# Patient Record
Sex: Female | Born: 1980 | Race: White | Hispanic: No | Marital: Married
Health system: Southern US, Community
[De-identification: ages and names within clinical notes are randomized; demographics above are authoritative.]

---

## 2008-07-04 ENCOUNTER — Emergency Department (HOSPITAL_COMMUNITY): Admission: EM | Admit: 2008-07-04 | Discharge: 2008-07-04 | Payer: Self-pay | Admitting: Family Medicine

## 2010-02-11 ENCOUNTER — Emergency Department (HOSPITAL_COMMUNITY): Admission: EM | Admit: 2010-02-11 | Discharge: 2010-02-12 | Payer: Self-pay | Admitting: Emergency Medicine

## 2010-10-28 LAB — DIFFERENTIAL
Basophils Relative: 0 % (ref 0–1)
Lymphocytes Relative: 5 % — ABNORMAL LOW (ref 12–46)
Lymphs Abs: 0.3 10*3/uL — ABNORMAL LOW (ref 0.7–4.0)
Monocytes Absolute: 0.3 10*3/uL (ref 0.1–1.0)
Monocytes Relative: 4 % (ref 3–12)
Neutro Abs: 5.9 10*3/uL (ref 1.7–7.7)

## 2010-10-28 LAB — URINALYSIS, ROUTINE W REFLEX MICROSCOPIC
Bilirubin Urine: NEGATIVE
Glucose, UA: NEGATIVE mg/dL
Specific Gravity, Urine: 1.03 (ref 1.005–1.030)
pH: 6 (ref 5.0–8.0)

## 2010-10-28 LAB — URINE MICROSCOPIC-ADD ON

## 2010-10-28 LAB — CBC
HCT: 44.1 % (ref 36.0–46.0)
Hemoglobin: 15.5 g/dL — ABNORMAL HIGH (ref 12.0–15.0)
MCHC: 35.2 g/dL (ref 30.0–36.0)
RBC: 4.94 MIL/uL (ref 3.87–5.11)

## 2010-10-28 LAB — POCT PREGNANCY, URINE: Preg Test, Ur: NEGATIVE

## 2010-10-28 LAB — BASIC METABOLIC PANEL
GFR calc Af Amer: >60 mL/min (ref >60)
GFR calc non Af Amer: >60 mL/min (ref >60)
Glucose, Bld: 129 mg/dL — ABNORMAL HIGH (ref 70–99)
Potassium: 3.4 mEq/L — ABNORMAL LOW (ref 3.5–5.1)
Sodium: 137 mEq/L (ref 135–145)

## 2011-03-22 ENCOUNTER — Ambulatory Visit: Payer: Self-pay | Admitting: Family Medicine

## 2011-06-28 ENCOUNTER — Ambulatory Visit: Payer: Self-pay | Admitting: Urology

## 2011-10-07 ENCOUNTER — Encounter: Payer: Self-pay | Admitting: Obstetrics & Gynecology

## 2011-10-24 ENCOUNTER — Encounter: Payer: Self-pay | Admitting: Obstetrics & Gynecology

## 2011-11-07 ENCOUNTER — Encounter: Payer: Self-pay | Admitting: Obstetrics & Gynecology

## 2011-11-14 ENCOUNTER — Encounter: Payer: Self-pay | Admitting: Maternal & Fetal Medicine

## 2011-11-21 ENCOUNTER — Encounter: Payer: Self-pay | Admitting: Obstetrics & Gynecology

## 2011-11-28 ENCOUNTER — Encounter: Payer: Self-pay | Admitting: Obstetrics & Gynecology

## 2011-12-02 ENCOUNTER — Inpatient Hospital Stay: Payer: Self-pay | Admitting: Obstetrics and Gynecology

## 2011-12-02 LAB — CBC WITH DIFFERENTIAL/PLATELET
Basophil %: 0.3 %
Eosinophil %: 0.2 %
HGB: 13.3 g/dL (ref 12.0–16.0)
Lymphocyte #: 1.1 10*3/uL (ref 1.0–3.6)
Lymphocyte %: 16.3 %
MCH: 30.3 pg (ref 26.0–34.0)
MCHC: 33.6 g/dL (ref 32.0–36.0)
MCV: 90 fL (ref 80–100)
Neutrophil %: 77.6 %
WBC: 6.9 10*3/uL (ref 3.6–11.0)

## 2013-02-19 ENCOUNTER — Ambulatory Visit: Payer: Self-pay | Admitting: Internal Medicine

## 2013-02-23 ENCOUNTER — Ambulatory Visit: Payer: Self-pay | Admitting: Surgery

## 2013-02-25 LAB — PATHOLOGY REPORT

## 2014-03-17 ENCOUNTER — Ambulatory Visit: Payer: Self-pay | Admitting: Obstetrics and Gynecology

## 2014-03-17 LAB — HEMOGLOBIN: HGB: 13.5 g/dL (ref 12.0–16.0)

## 2014-03-21 ENCOUNTER — Ambulatory Visit: Payer: Self-pay | Admitting: Obstetrics and Gynecology

## 2014-03-23 LAB — PATHOLOGY REPORT

## 2014-03-26 IMAGING — US US OB FOLLOW-UP - NRPT [facility]
1 series · 14 of 28 positions shown · non-contrast
Comparison: none

[Series 1: us ob follow-up - nrpt [facility] · 14 of 57 slices shown]
[im 3/57]
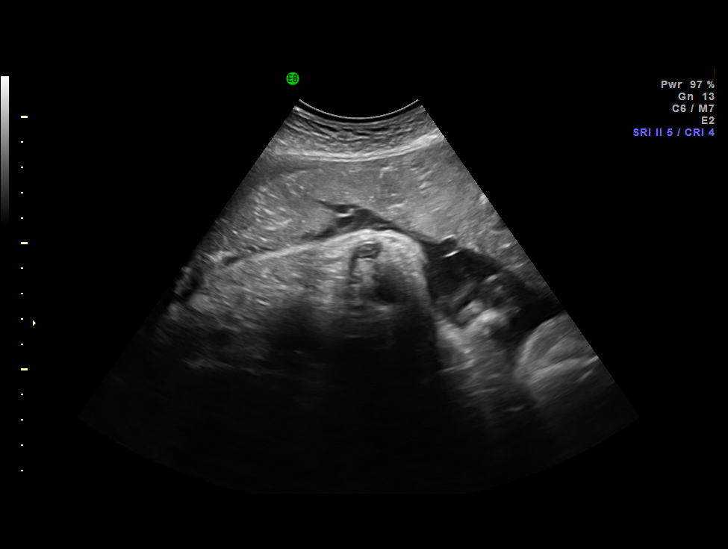
[im 7/57]
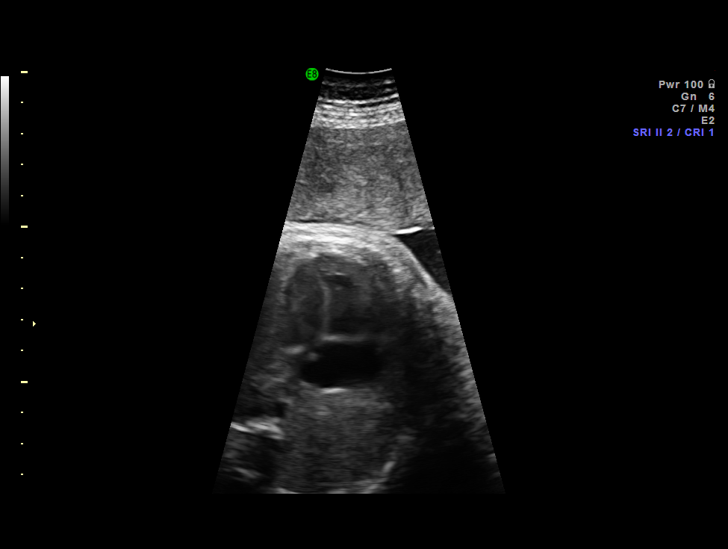
[im 11/57]
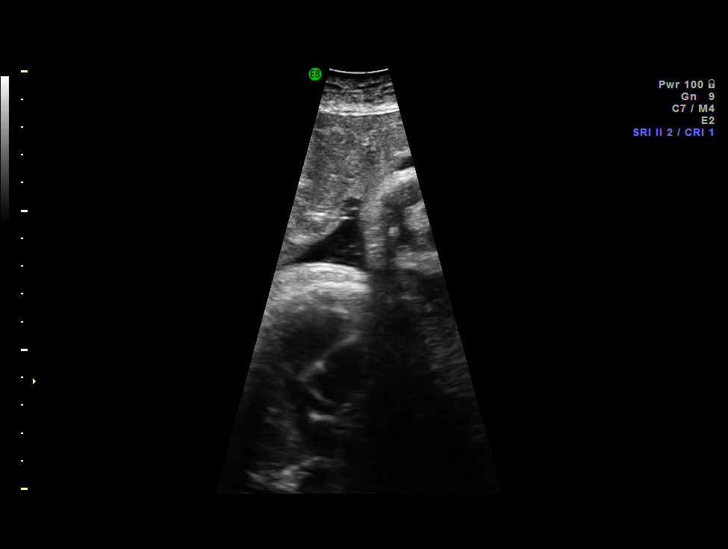
[im 15/57]
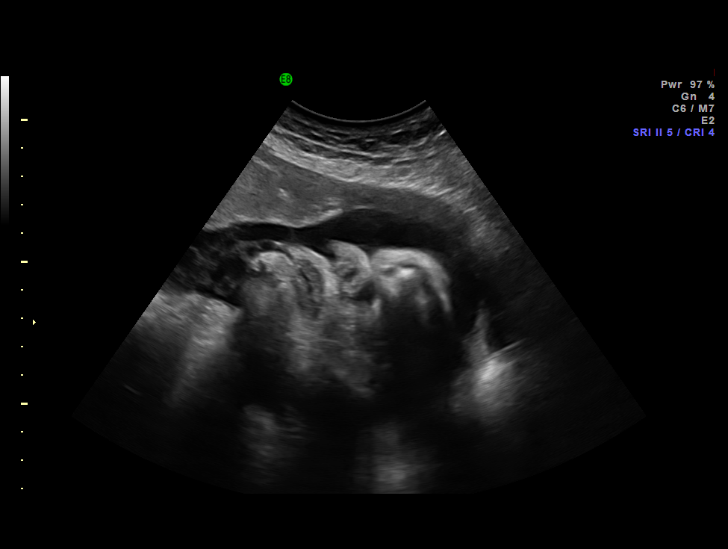
[im 19/57]
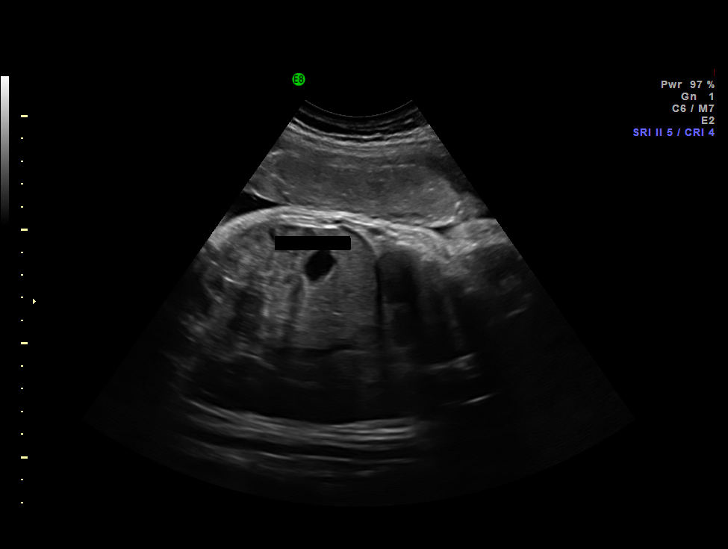
[im 23/57]
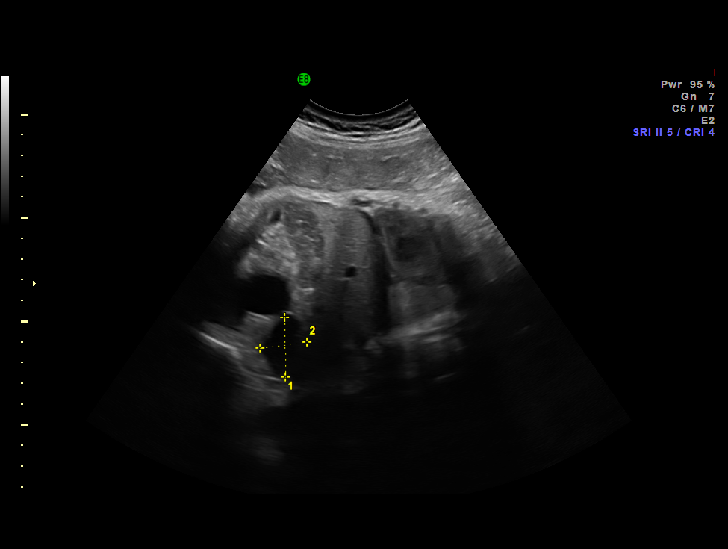
[im 27/57]
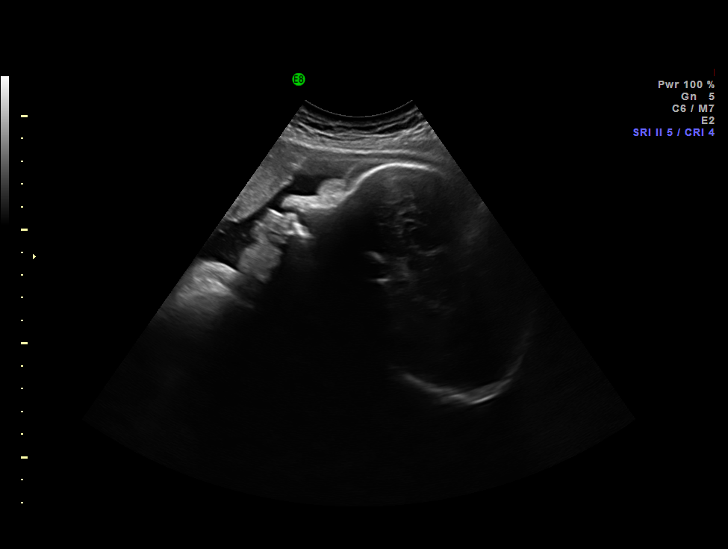
[im 32/57]
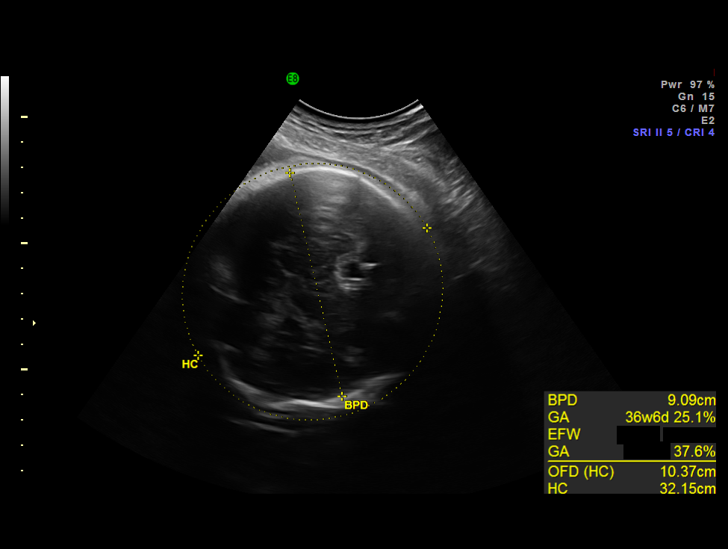
[im 36/57]
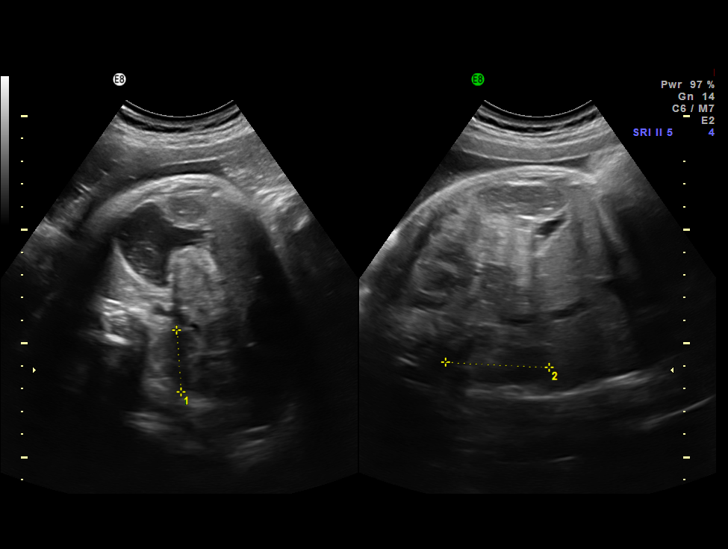
[im 40/57]
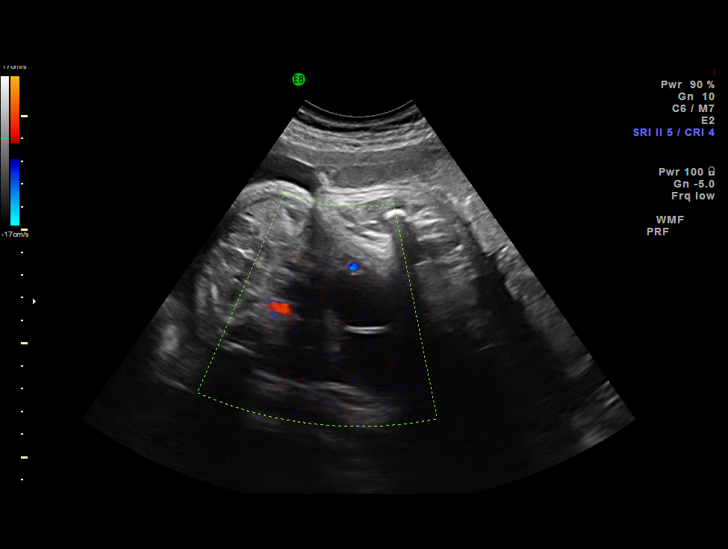
[im 44/57]
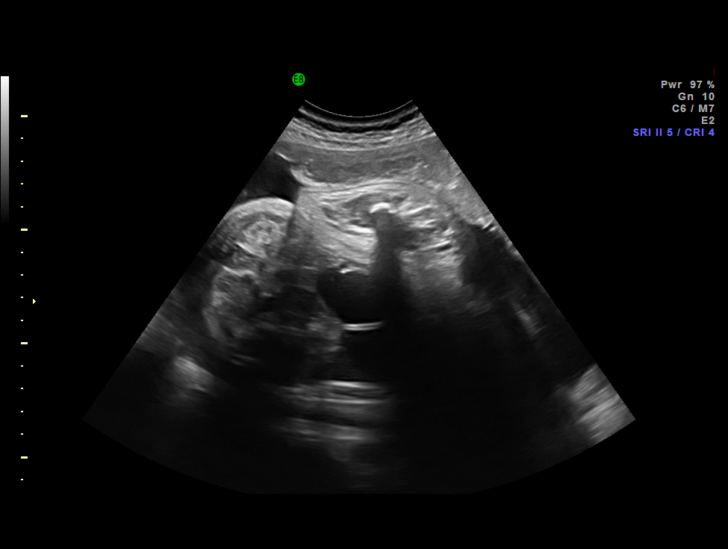
[im 48/57]
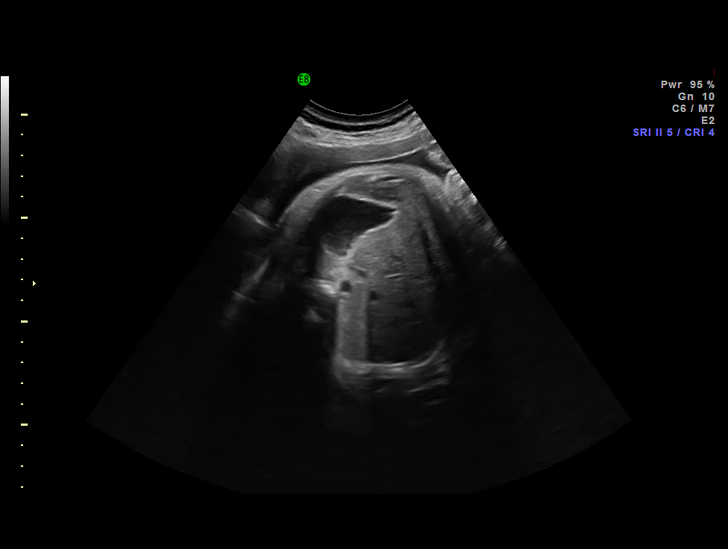
[im 52/57]
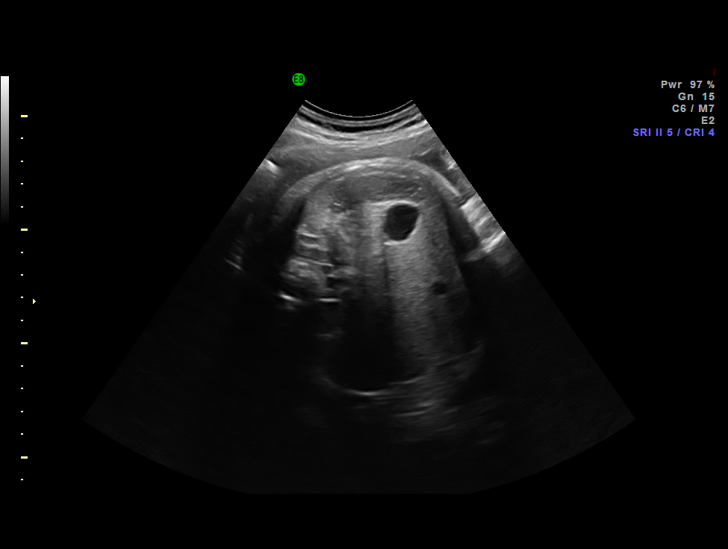
[im 57/57]
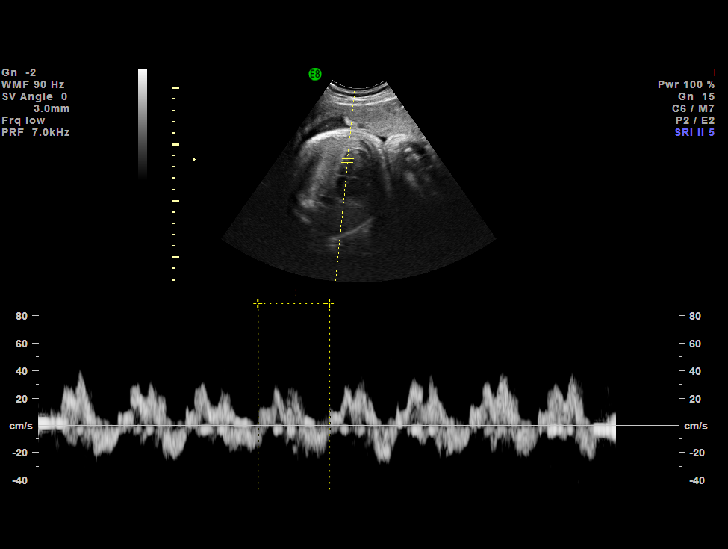

[14 of 28 positions shown; findings below may reference images not displayed]

IMAGES IMPORTED FROM THE SYNGO WORKFLOW SYSTEM
NO DICTATION FOR STUDY

## 2014-04-02 IMAGING — US US OB FOLLOW-UP - [facility]PT [facility]
1 series · 14 of 28 positions shown · non-contrast
Comparison: none

[Series 1: us ob follow-up - nrpt [facility] · 14 of 29 slices shown]
[im 2/29]
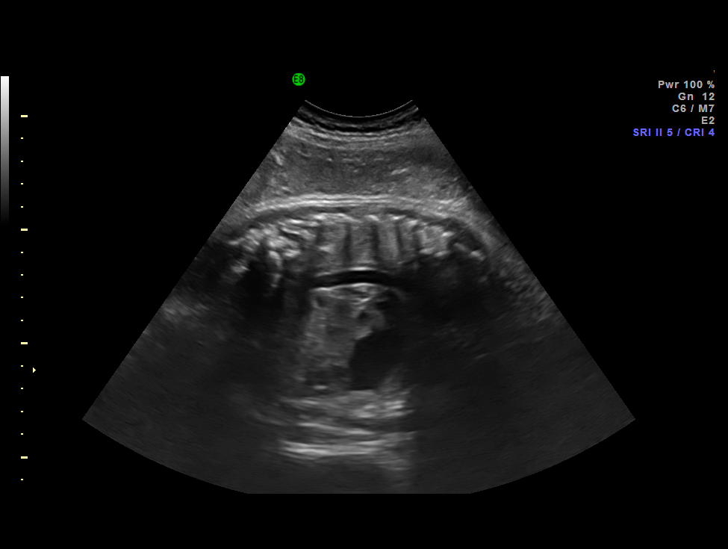
[im 4/29]
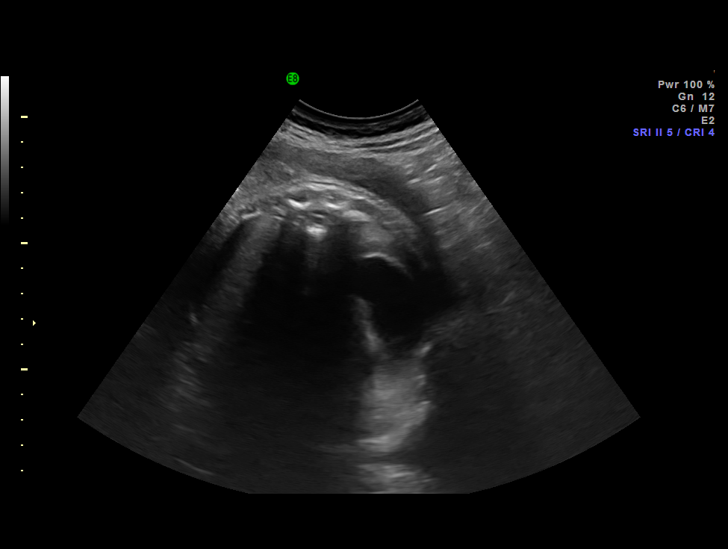
[im 6/29]
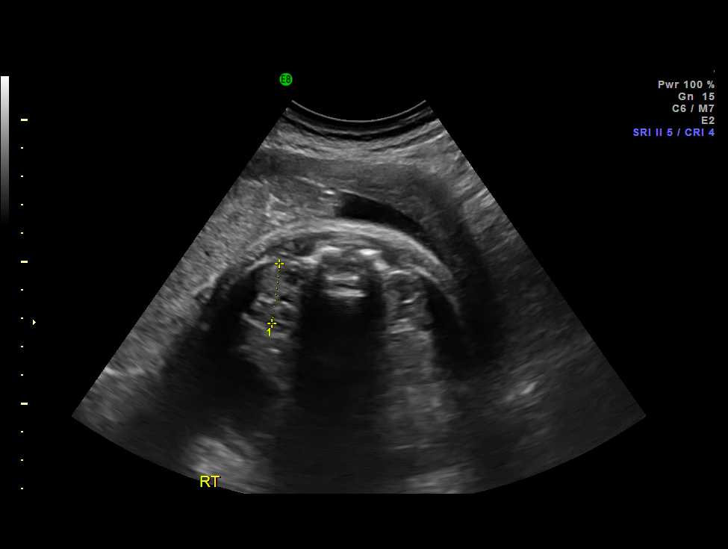
[im 8/29]
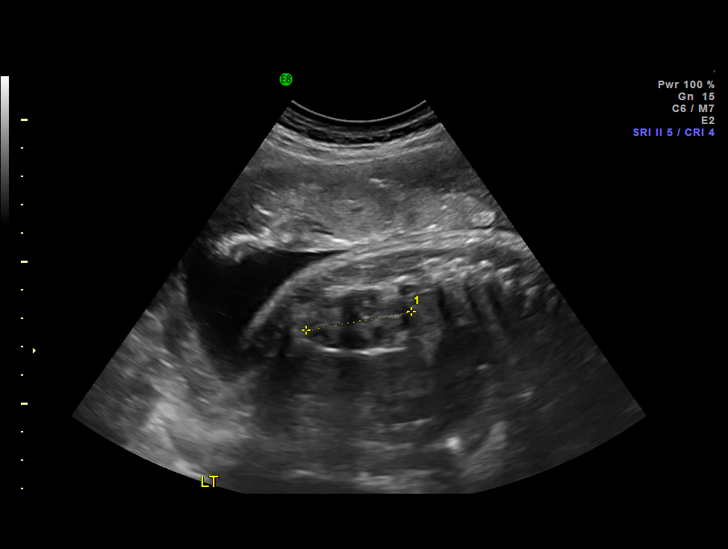
[im 10/29]
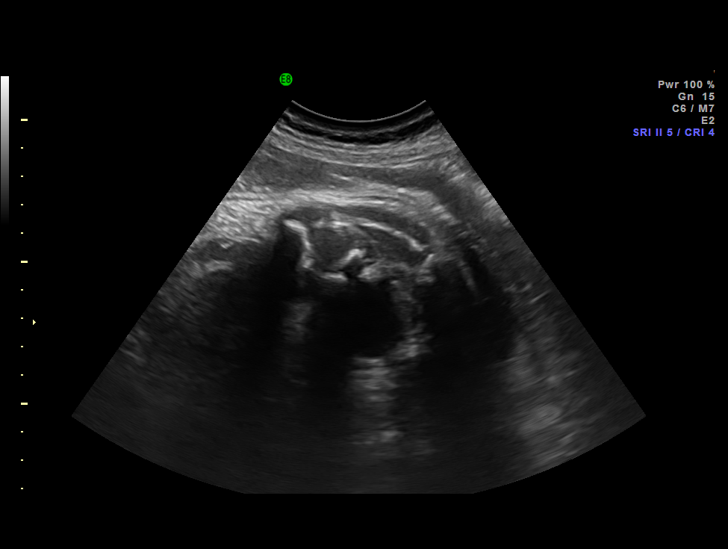
[im 12/29]
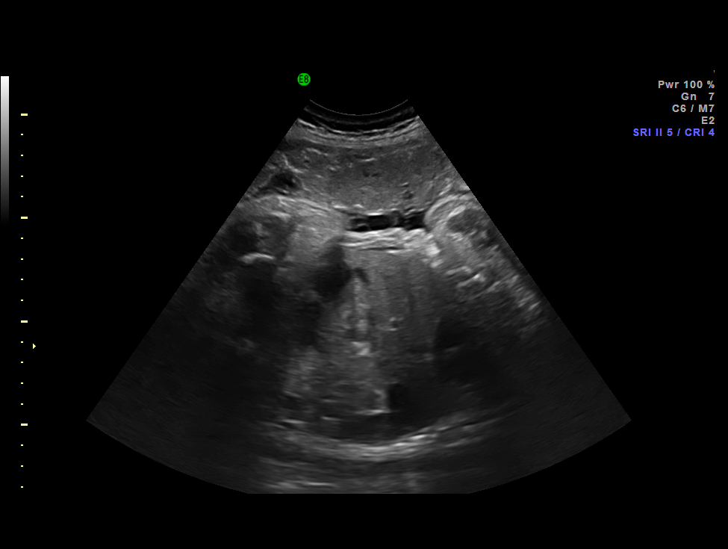
[im 14/29]
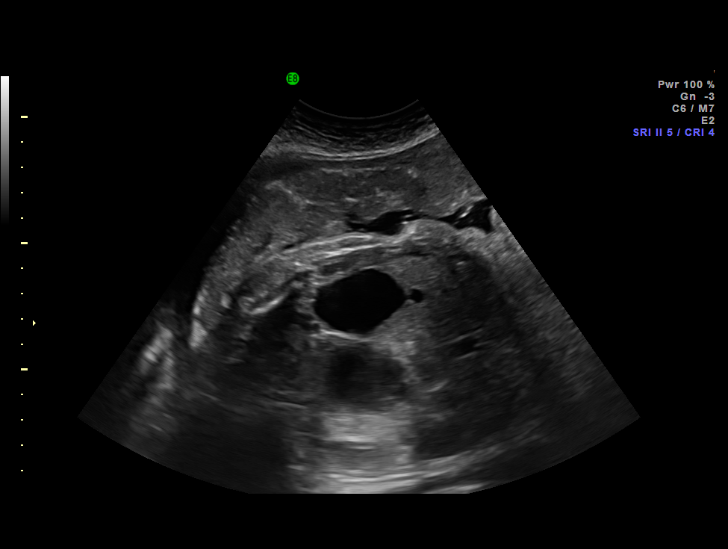
[im 16/29]
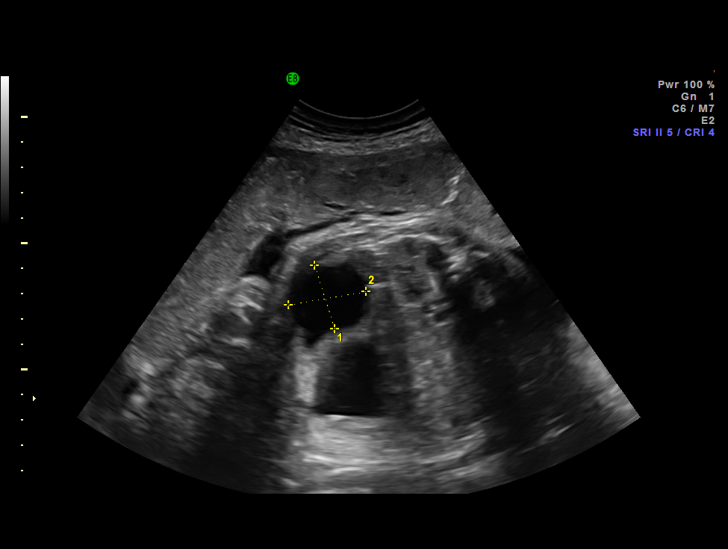
[im 18/29]
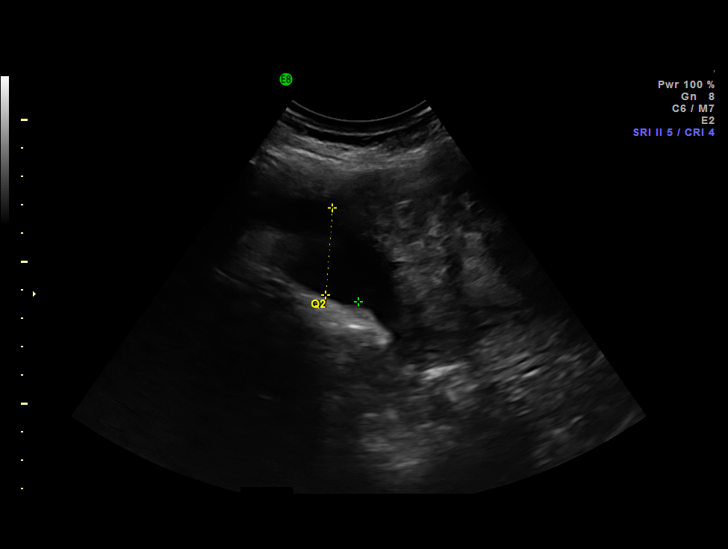
[im 20/29]
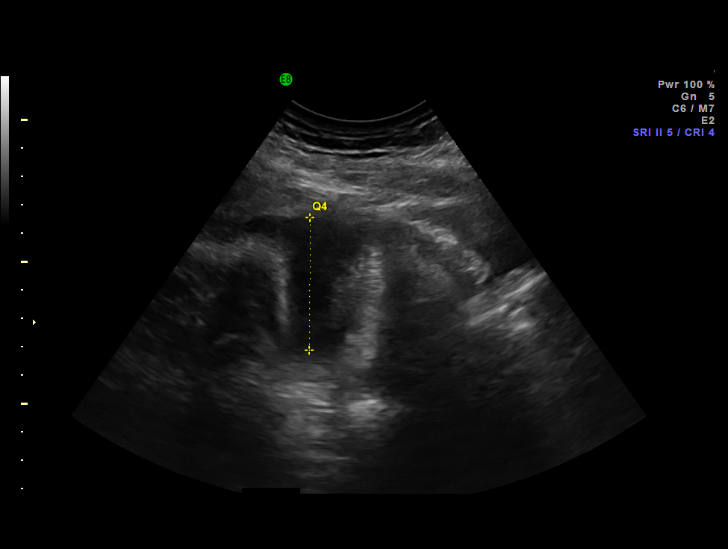
[im 22/29]
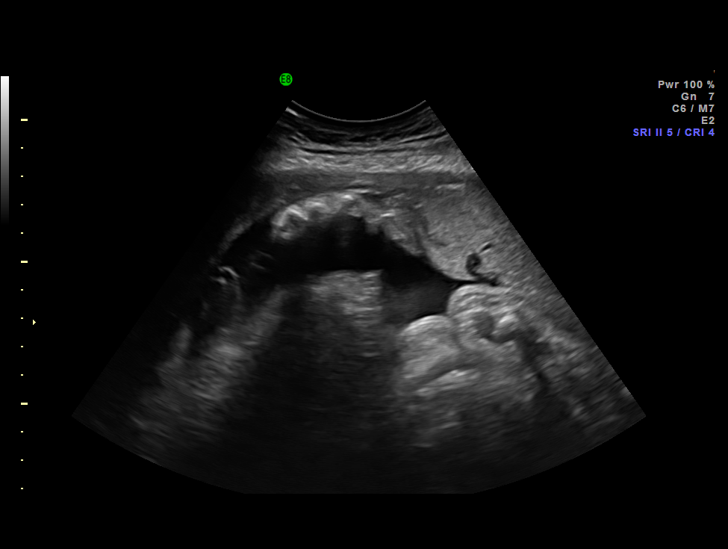
[im 24/29]
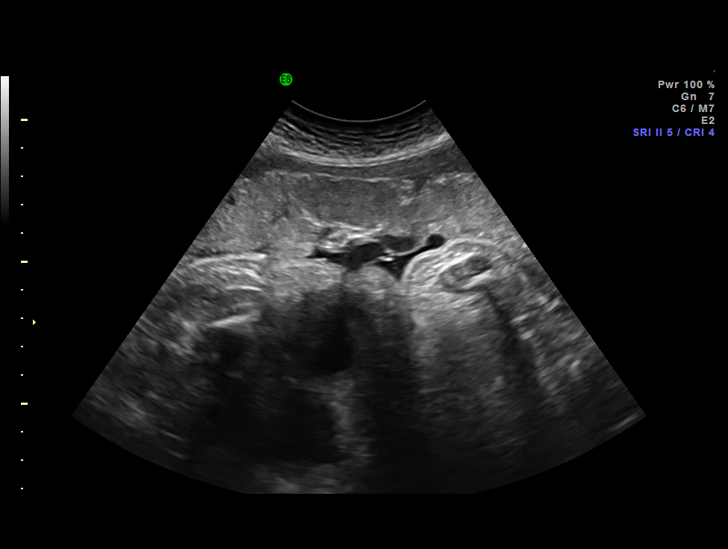
[im 26/29]
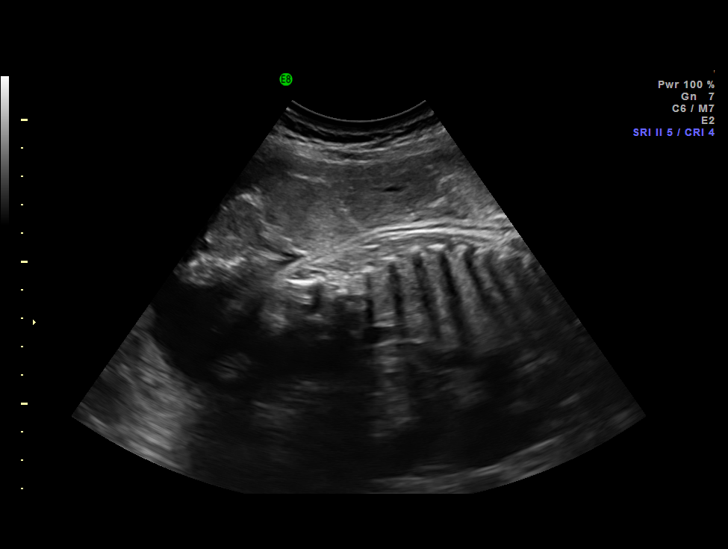
[im 29/29]
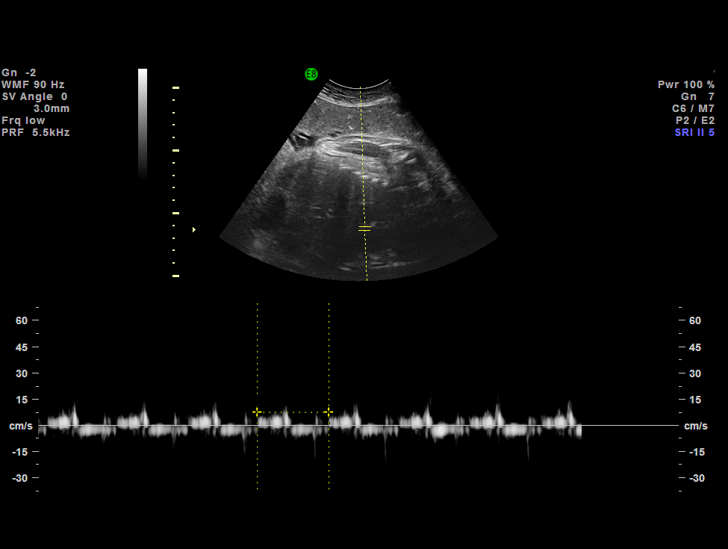

[14 of 28 positions shown; findings below may reference images not displayed]

IMAGES IMPORTED FROM THE SYNGO WORKFLOW SYSTEM
NO DICTATION FOR STUDY

## 2014-06-17 ENCOUNTER — Ambulatory Visit: Payer: Self-pay | Admitting: Internal Medicine

## 2014-06-17 LAB — CBC CANCER CENTER
Basophil #: 0 x10 3/mm (ref 0.0–0.1)
Basophil %: 0.6 %
Eosinophil #: 0 x10 3/mm (ref 0.0–0.7)
Eosinophil %: 0.7 %
HCT: 45.5 % (ref 35.0–47.0)
HGB: 15.1 g/dL (ref 12.0–16.0)
LYMPHS ABS: 1.3 x10 3/mm (ref 1.0–3.6)
Lymphocyte %: 24.5 %
MCH: 29.4 pg (ref 26.0–34.0)
MCHC: 33.3 g/dL (ref 32.0–36.0)
MCV: 88 fL (ref 80–100)
Monocyte #: 0.3 x10 3/mm (ref 0.2–0.9)
Monocyte %: 6.4 %
Neutrophil #: 3.6 x10 3/mm (ref 1.4–6.5)
Neutrophil %: 67.8 %
PLATELETS: 226 x10 3/mm (ref 150–440)
RBC: 5.15 10*6/uL (ref 3.80–5.20)
RDW: 12.7 % (ref 11.5–14.5)
WBC: 5.4 x10 3/mm (ref 3.6–11.0)

## 2014-06-30 ENCOUNTER — Ambulatory Visit: Payer: Self-pay | Admitting: Obstetrics and Gynecology

## 2014-06-30 LAB — CBC WITH DIFFERENTIAL/PLATELET
BASOS PCT: 0.4 %
Basophil #: 0 10*3/uL (ref 0.0–0.1)
EOS ABS: 0 10*3/uL (ref 0.0–0.7)
Eosinophil %: 0.6 %
HCT: 42.5 % (ref 35.0–47.0)
HGB: 14.2 g/dL (ref 12.0–16.0)
LYMPHS ABS: 1.3 10*3/uL (ref 1.0–3.6)
LYMPHS PCT: 18.7 %
MCH: 29.3 pg (ref 26.0–34.0)
MCHC: 33.3 g/dL (ref 32.0–36.0)
MCV: 88 fL (ref 80–100)
MONO ABS: 0.4 x10 3/mm (ref 0.2–0.9)
Monocyte %: 5.4 %
NEUTROS PCT: 74.9 %
Neutrophil #: 5 10*3/uL (ref 1.4–6.5)
PLATELETS: 197 10*3/uL (ref 150–440)
RBC: 4.83 10*6/uL (ref 3.80–5.20)
RDW: 12.8 % (ref 11.5–14.5)
WBC: 6.7 10*3/uL (ref 3.6–11.0)

## 2014-07-04 ENCOUNTER — Ambulatory Visit: Payer: Self-pay | Admitting: Obstetrics and Gynecology

## 2014-07-12 ENCOUNTER — Ambulatory Visit: Payer: Self-pay | Admitting: Internal Medicine

## 2014-12-02 NOTE — Op Note (Signed)
PATIENT NAME:  Lauren Gaines, Lauren Gaines MR#:  [id number] DATE OF BIRTH:  October 23, 1980  DATE OF PROCEDURE:  02/23/2013  PREOPERATIVE DIAGNOSIS: Cholecystitis, cholelithiasis.   POSTOPERATIVE DIAGNOSIS: Cholecystitis, cholelithiasis.  PROCEDURE: Laparoscopic cholecystectomy, cholangiogram.   SURGEON: Renda RollsWilton Smith, MD   ANESTHESIA: General.   INDICATIONS: This 34 year old female has a history of epigastric pains, ultrasound findings of gallstones. Surgery was recommended for definitive treatment.   DESCRIPTION OF PROCEDURE: The patient was placed on the operating table in the supine position under general anesthesia. The abdomen was prepared with ChloraPrep and draped in a sterile manner.   A short incision was made in the inferior aspect of the umbilicus and carried down through the subcutaneous tissues. The deep fascia was grasped with laryngeal hook and elevated. A Veress needle was inserted, aspirated and irrigated with a saline solution. Next, the peritoneal cavity was inflated with carbon dioxide. The Veress needle was removed. The 10 mm cannula was inserted. The 10 mm, 0-degree laparoscope was inserted to view the peritoneal cavity. Another incision was made in the epigastrium slightly to the right of the midline to introduce an 11 mm cannula. Two incisions were made in the lateral aspect of the right upper quadrant to introduce two 5-mm cannulas.   Initial inspection revealed the liver was normal. The gallbladder was identified. There were some adhesions surrounding the gallbladder. The gallbladder was retracted towards the right shoulder. These multiple adhesions were taken down with blunt and sharp dissection. Several small bleeding points were cauterized.  The infundibulum was retracted inferiorly and laterally. The porta hepatis was demonstrated, the common bile duct identified. The gallbladder was mobilized with incision of the visceral peritoneum. The cystic duct was dissected free from  surrounding structures. The cystic artery was dissected free from surrounding structures. A critical view of safety was demonstrated. The cystic artery was controlled with Endo Clips and divided, allowing better traction on the cystic duct. An Endo Clip was placed across the cystic duct adjacent to the neck of the gallbladder. An incision was made in the cystic duct to introduce a Reddick catheter. Half-strength Conray-60 dye was injected as the cholangiogram was done with fluoroscopy, viewing the biliary tree and prompt flow of dye into the duodenum. The pancreatic duct was also identified and appeared typical. Next, the Reddick catheter was removed. The cystic duct was doubly ligated with Endo Clips and divided. The gallbladder was dissected free from the liver with hook and cautery. Bleeding was minimal. The site was irrigated with heparinized saline solution and aspirated. The gallbladder was delivered up through the infraumbilical incision, opened and suctioned. Multiple stones were removed with the stone scoop and stone forceps.  The gallbladder was removed and sent with stones for routine pathology. The right upper quadrant was further inspected, irrigated and aspirated. Hemostasis was intact. The cannulas were removed allowing carbon dioxide to escape from the peritoneal cavity. Several small bleeding points were cauterized. Subcutaneous tissues were infiltrated with 0.5% Sensorcaine with epinephrine. The skin edges were approximated with interrupted 5-0 chromic subcuticular sutures, benzoin and Steri-Strips. Dressings were applied with paper tape. The patient tolerated surgery satisfactorily and was then prepared for transfer to the recovery room.   ____________________________ Shela CommonsJ. Renda RollsWilton Smith, MD jws:cb D: 02/23/2013 15:59:42 ET T: 02/23/2013 16:13:15 ET JOB#: [id number]  cc: Adella HareJ. Wilton Smith, MD, <Dictator> Adella HareWILTON J SMITH MD ELECTRONICALLY SIGNED 02/23/2013 18:00

## 2014-12-03 NOTE — Op Note (Signed)
PATIENT NAME:  [name], Lauren Gaines MR#:  [id number] DATE OF BIRTH:  02-14-1981  DATE OF PROCEDURE:  07/04/2014  PREOPERATIVE DIAGNOSIS: Chronic pregnancy loss.   POSTOPERATIVE DIAGNOSIS: Chronic pregnancy loss.  PROCEDURE PERFORMED: Dilatation and curettage, hysteroscopy.   SURGEON: Cline CoolsBethany E. Darril Patriarca, MD  INTRAVENOUS FLUIDS: 700 mL.   ESTIMATED BLOOD LOSS: None.   URINE OUTPUT: 25 mL   ANESTHESIA: General.   COMPLICATIONS: None.   FINDINGS: Normal uterus, ostia, cornua and cervix noted. No submucosal fibroids, polyps or uterine septa seen. The fundus directly measured 9 cm, with an oblique angle to each cornu of 10 cm each.   SPECIMENS: Endometrial curettings sent for chronic endometritis.   CONDITION: Stable to PACU.   INDICATION FOR PROCEDURE: [name] is a 34 year old, gravida 5, para 1-0-4-1, with a history of 4 early first trimester losses and 1 normal pregnancy. Workup for recurrent pregnancy loss has been negative to date, including karyotype for both partners, as well as coagulopathies. For this reason, the patient underwent a hysteroscopy with the findings noted above. She is referred to reproductive endocrinology for further management and workup, as the patient states that this is the last time she will try to get pregnant.   DESCRIPTION OF PROCEDURE: The patient was taken to the Operating Room, where she was identified by name and birthdate. She was placed on the operating table in the supine position and general anesthesia was induced without difficulty. She was then positioned in the dorsal lithotomy position, and prepped and draped in the usual sterile fashion. A formal timeout procedure was performed with all team members present and in agreement.   A bivalve speculum was placed in the vagina, and the anterior lip of the cervix was grasped with a single-tooth tenaculum. The uterus sounded to 9 cm directly, and to 10 cm at an oblique angle into each of the  cornua. The cervix was then serially dilated up to an #8 Hegar, and the hysteroscope introduced. The patient was on day 4 of her menstrual cycle, and therefore there the field was mildly obscured on first entry. However, fluid management did allow us to see the full uterine cavity. Evaluation of both ostia revealed that they were normal, as above. No septum was noted, and there were no uterine fibroids or polyps noted as well.   The hysteroscope was then removed and endometrial curettings were taken in all 4 quadrants for a single slice. These were sent off to pathology.   All instruments were then removed from the vagina. The cervix required silver nitrate cautery for hemostasis, but at the end of the procedure, there was no bleeding noted.   General anesthesia was reversed without difficulty, and the patient is stable in recovery.    ____________________________ Cline CoolsBethany E. Jashawn Floyd, MD beb:MT D: 07/04/2014 10:38:00 ET T: 07/04/2014 11:23:09 ET JOB#: [id number]  cc: Cline CoolsBethany E. Kyrel Leighton, MD, <Dictator> Cline CoolsBETHANY E Joniyah Mallinger MD ELECTRONICALLY SIGNED 07/21/2014 10:57

## 2014-12-03 NOTE — Op Note (Signed)
PATIENT NAME:  Lauren Gaines, Lauren Gaines MR#:  [id number] DATE OF BIRTH:  1981/02/01  DATE OF PROCEDURE:  03/21/2014  PREOPERATIVE DIAGNOSIS: Missed abortion.   POSTOPERATIVE DIAGNOSIS: Missed abortion.   PROCEDURE: Suction dilation and curettage.   ANESTHESIA: General endotracheal.   SURGEON: Dr. Feliberto GottronSchermerhorn.   INDICATIONS: This is a 34 year old G5, P1, patient with falling beta-hCGs. The patient has been diagnosed with a missed abortion. Blood type is type O+.   PROCEDURE: After adequate general endotracheal anesthesia, the patient was placed in the dorsal supine position with the legs in the candy cane stirrups. The lower abdomen, perineal, and vagina were prepped with Betadine. The patient was sterilely draped. A red Robinson catheter yielded 200 mL clear urine. Exam under anesthesia revealed a retroverted uterus. A weighted speculum was placed in the posterior vaginal vault and the anterior cervix and the uterus was grasped with a single-tooth tenaculum, and the cervix was then dilated to #20 Hanks dilator without difficulty. A #8 flexible suction curette was brought up to the operative field and the suction curette was advanced into the endometrial cavity without difficulty. She has adequate tissue removed consistent with products of conception. Sharp curettage was then performed with no additional tissue and a repeat suction curette again with no additional tissue. Good hemostasis was noted. The single-tooth tenaculum was taken off taken the anterior cervix. The patient was taken to the recovery room in good condition.   ESTIMATED BLOOD LOSS: 50 mL.  ESTIMATED INTRAOPERATIVE FLUIDS: 600 mL.  URINE OUTPUT: 200 mL.  The patient was taken to the recovery room in good condition.    ____________________________ Lauren Bouchardhomas J. Shelita Steptoe, MD tjs:lt D: 03/21/2014 13:17:24 ET T: 03/21/2014 17:18:18 ET JOB#: [id number]  cc: Lauren Bouchardhomas J. Emillee Talsma, MD, <Dictator> Lauren BouchardHOMAS J Alexismarie Flaim  MD ELECTRONICALLY SIGNED 03/22/2014 9:06

## 2014-12-04 NOTE — Consult Note (Signed)
    Maternal Age 34    Gravida 4    Para 0    Term 0    PreTerm 0    Abortion 3    Living 0    EDC 28-Nov-2011    Gestational Age (wks, days) 40 4/7    Gestation Single    Maternal Blood Type O    Maternal Rh Positive    Indirect Coombs Negative    Maternal HIV Negative    Maternal Syphilis Ab Nonreactive    Maternal Rubella Immune    Maternal HBsAg Negative    Maternal GBS Negative    GBS Prophylaxis Not Indicated    Other Maternal Labs LMP - 02/21/2011    Prenatal Care Adequate    Family/Social History nothing contributory     Additional Comments I was consulted as the fetus has h/o intrab-abdominal cyst 3.7x2.6x3.1cm noted on prenatal US [facility]. 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 Cyst has decreased in size over time, per mom. Otherwise this is an uncomplicated pregnancy, being induced for postdates.  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  I discussed with family about the potential differential and outcome. Explained that ovarian cysts resolve spontaneously and duplication cysts need surgical consult and follow up. We will obtain abdomina US post delivery and discuss the options related to one particular diagnosis. Mom is interested in breastfeeding. She is being induced today for post dates. If there are no complication with delivery, will send the baby to mother -baby unit to be with mother and receive routine newborn care. Both the parents and MGM expressed the understanding. I answered all their questions.   Thank you for the consultation. I would be happy to talk to mom again if further questions arise.    Parental Contact Parents informed at length regarding prenatal care and plan, Time spent 20 minutes   Electronic Signatures: Fara Oldenasnadi, Lauree Yurick Parveen (MD)  (Signed 22-Apr-13  14:07)  Authored: PREGNANCY and LABOR, ADDITIONAL COMMENTS   Last Updated: 22-Apr-13 14:07 by Fara Oldenasnadi, Jazlyn Tippens Parveen (MD)

## 2014-12-05 LAB — SURGICAL PATHOLOGY

## 2014-12-20 NOTE — H&P (Signed)
L&D Evaluation:  History:   HPI 34y/o [id number] [facility] here for IOL due to postdates and known Intrabdominal fetal cyst (see [facility] consults) Cyst visualized on right side as well as left side 3.7x2.6.3.1cm simple in nature (Enteric duplication or ovarian cyst?) cyst is separate from bladder, nl bowel and kidneys. Per recent scan growth and AFI nl.    Presents with above    Patient's Medical History No Chronic Illness    Patient's Surgical History none    Medications Pre Natal Vitamins    Allergies NKDA, LATEX sensitivity    Social History none    Family History Non-Contributory   ROS:   ROS All systems were reviewed.  HEENT, CNS, GI, GU, Respiratory, CV, Renal and Musculoskeletal systems were found to be normal.   Exam:   Vital Signs stable    Urine Protein not completed    General no apparent distress    Mental Status clear    Chest clear    Heart normal sinus rhythm    Abdomen gravid, non-tender    Estimated Fetal Weight Average for gestational age    Fetal Position vtx    Back no CVAT    Edema no edema    Reflexes 2+    Clonus negative    Pelvic no external lesions, 3cm 50% vtx @-2 BOWI sm show    Mebranes Intact    FHT normal rate with no decels, baseline 140's avg variability  with accels    Ucx irregular    Skin dry    Lymph no lymphadenopathy   Impression:   Impression IOL   Plan:   Plan monitor contractions and for cervical change    Comments Admitted explained what to expect, questions answered. Consult to Dr Jerry Carasashnadi neonatology ([facility]) Plans epidural with labor progress, husband supportive at bedside.Will begin pitocin.   Electronic Signatures: Albertina ParrLugiano, Gessica Jawad B (CNM)  (Signed 22-Apr-13 11:35)  Authored: L&D Evaluation   Last Updated: 22-Apr-13 11:35 by Albertina ParrLugiano, Antrell Tipler B (CNM)

## 2015-06-25 IMAGING — US ABDOMEN ULTRASOUND
1 series · 13 of 25 positions shown · non-contrast
Comparison: none

REASON FOR EXAM: Epigastric pain
COMMENTS:

PROCEDURE:     US  - US ABDOMEN GENERAL SURVEY  - February 19, 2013  [DATE]
RESULT:

[Series 1: abdomen ultrasound · 0.21mm/px · 13 of 98 slices shown]
[im 1/98]
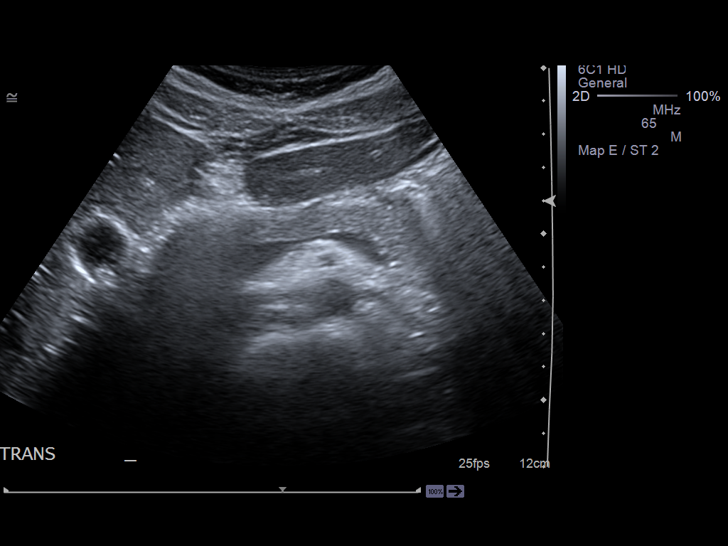
[im 9/98]
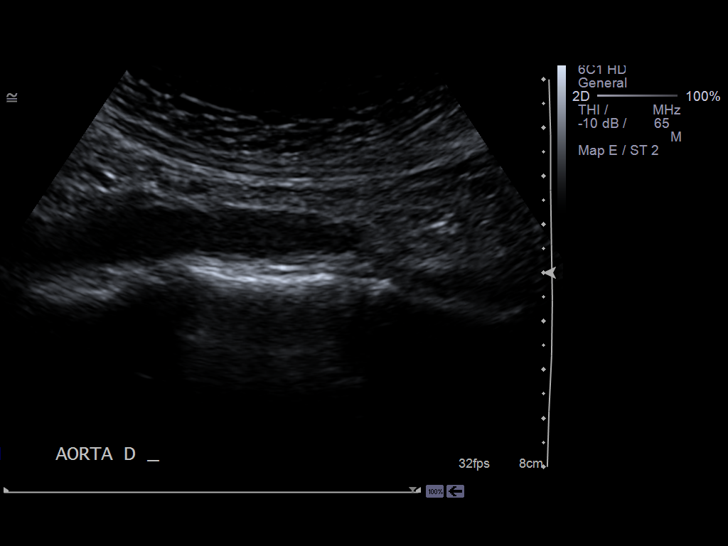
[im 17/98]
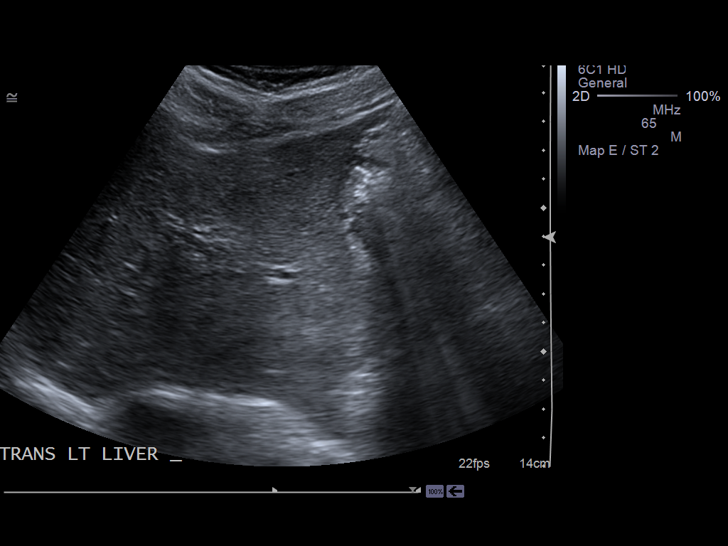
[im 25/98]
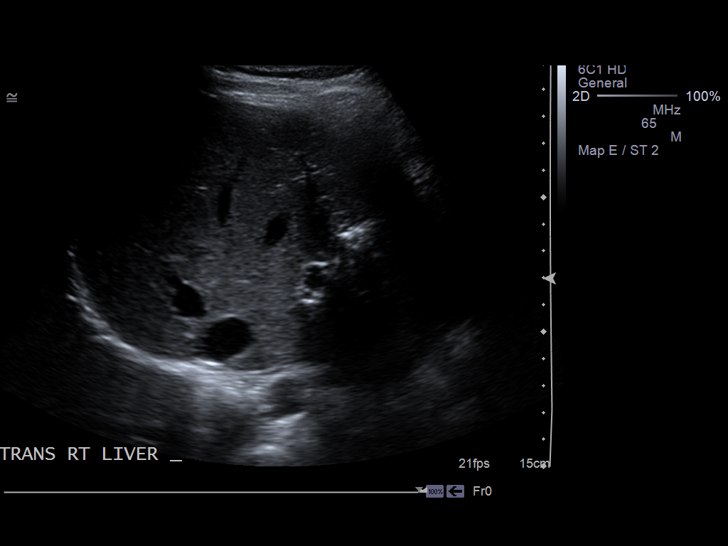
[im 33/98]
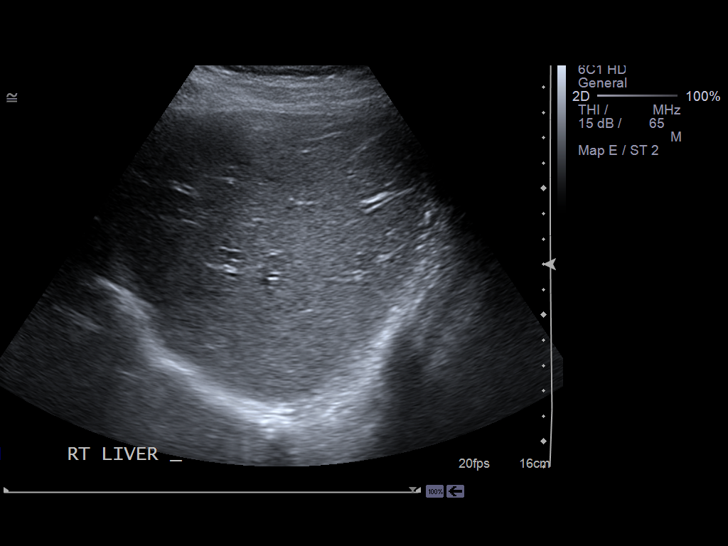
[im 41/98]
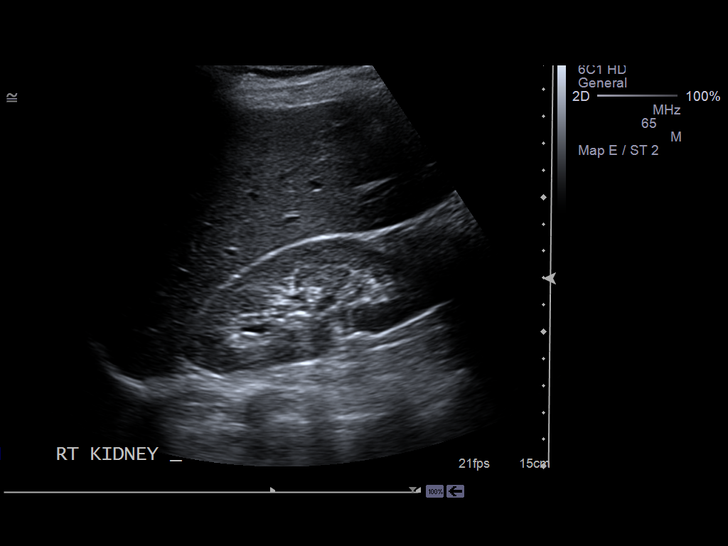
[im 49/98]
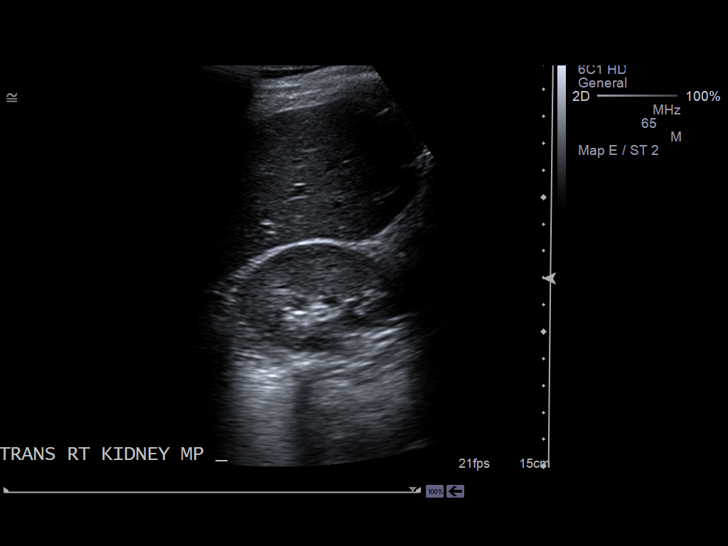
[im 57/98]
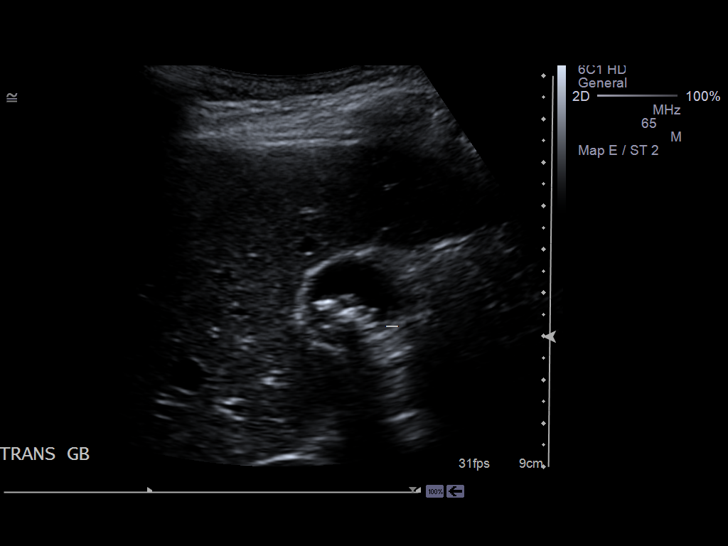
[im 65/98]
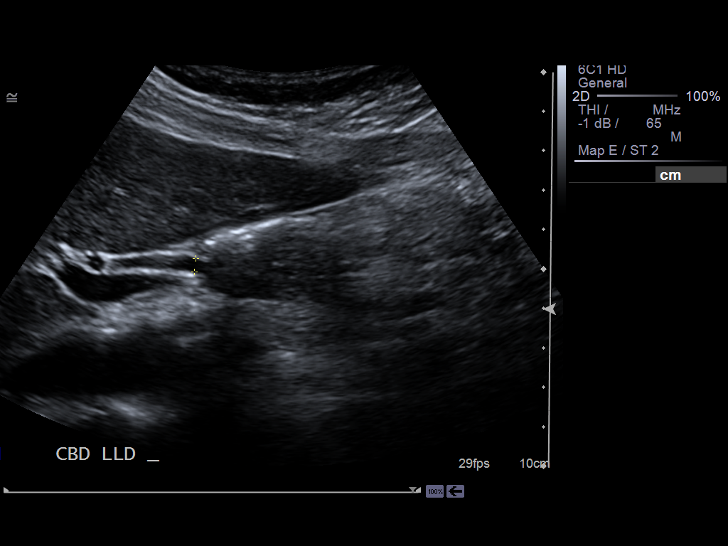
[im 73/98]
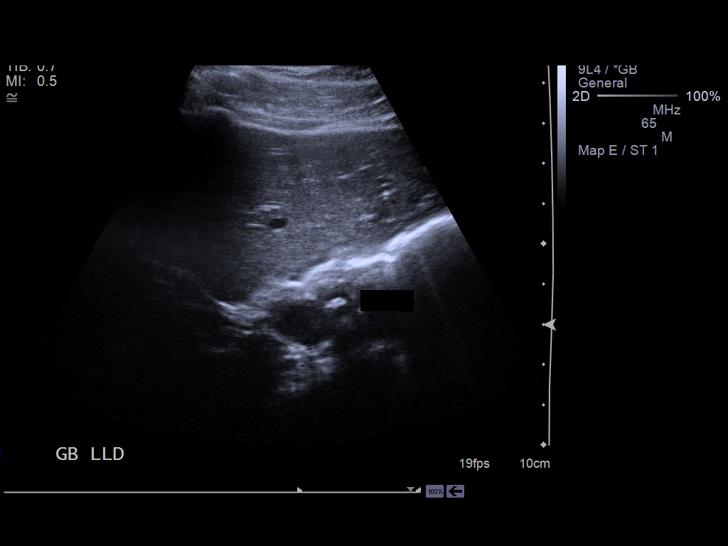
[im 81/98]
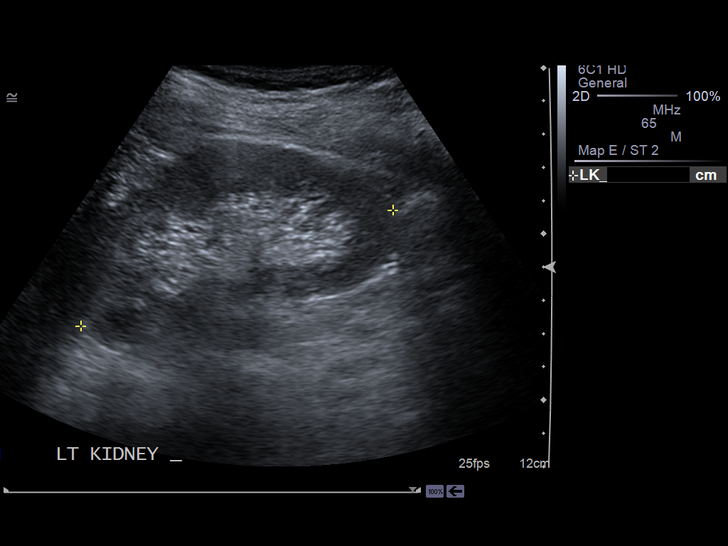
[im 89/98]
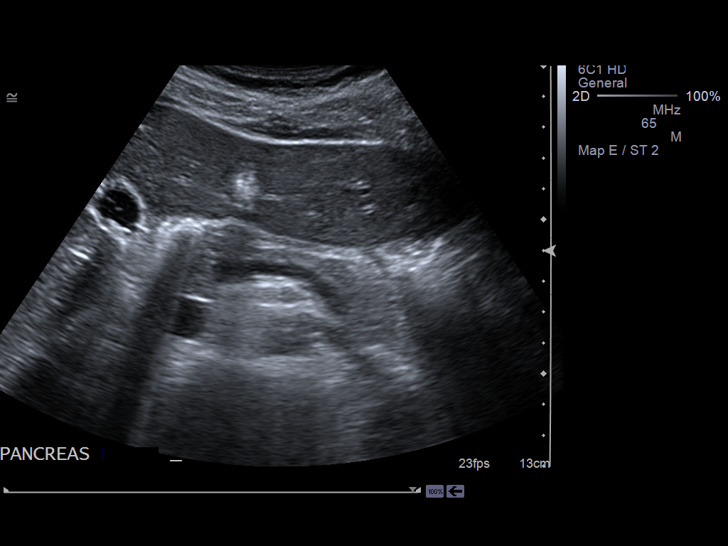
[im 98/98]
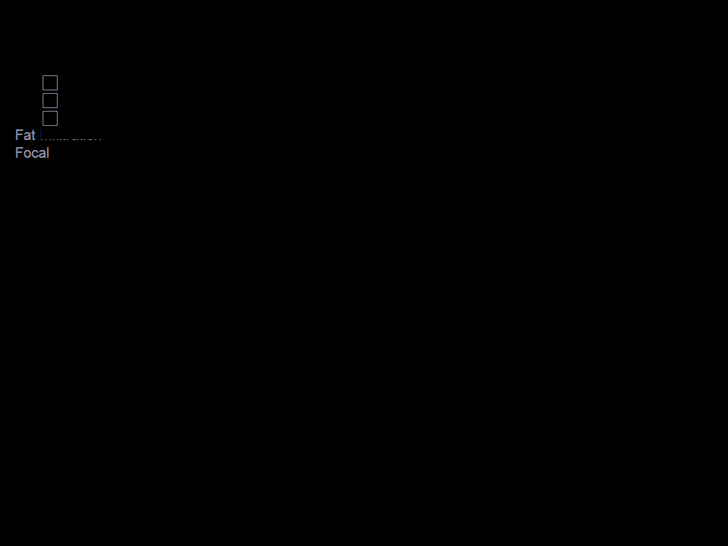

[13 of 25 positions shown; findings below may reference images not displayed]

FINDINGS: The liver demonstrates a homogeneous echotexture. Hepatopetal
flow is identified within the portal vein. The liver measures 15.18 cm along
the mid clavicular line. The aorta and IVC are unremarkable. The pancreas
and spleen is unremarkable. The spleen measures 9.32 cm in longitudinal
dimension. Evaluation of the gallbladder demonstrates multiple gallstones
which are primarily mobile though a gallstone is appreciated lodged within
the neck of the gallbladder. There is no evidence of a sonographic Murphy's
sign, pericholecystic fluid, gallbladder wall thickening or intrahepatic or
extrahepatic biliary ductal dilatation. Gallbladder wall thickness is 2 mm.
The common bile duct measures 3.6 mm in diameter. The kidneys demonstrate
appropriate corticomedullary differentiation without evidence of
hydronephrosis, solid or cystic masses, or calculi. The right kidney
measures 10.11 x 4.95 x 4.12 cm and the left 10.01 x 4.32 x 4.83 cm.
IMPRESSION: Multiple, mobile gallstones within the gallbladder as well
as a nonmobile gallstone in the neck of the gallbladder. There is no further
sonographic evidence to suggest cholecystitis and no further sonographic
abnormalities.

## 2021-01-15 ENCOUNTER — Other Ambulatory Visit: Payer: Self-pay

## 2021-01-15 ENCOUNTER — Ambulatory Visit: Payer: Managed Care, Other (non HMO) | Admitting: Dermatology

## 2021-01-15 DIAGNOSIS — L239 Allergic contact dermatitis, unspecified cause: Secondary | ICD-10-CM

## 2021-01-15 MED ORDER — CLOBETASOL PROPIONATE 0.05 % EX CREA: TOPICAL_CREAM | CUTANEOUS | 1 refills | Status: AC

## 2021-01-15 MED ORDER — PREDNISONE 5 MG PO TABS: ORAL_TABLET | ORAL | 0 refills | Status: AC

## 2021-01-15 NOTE — Progress Notes (Signed)
   New Patient Visit  Subjective  Lauren Gaines is a 40 y.o. female who presents for the following: Rash (New patient presents with rash that started on her chin 12 days ago. Rash then spread to bilateral feet, legs, and trunk. She was put on Prednisone 10mg  6 day taper and mometasone ointment. No improvement in rash or itch. She is currently taking Bactrim BID x 7 days with 2 days remaining. She takes Claritin daily and Benadryl nightly to help her sleep. Neither oral medicine has helped itch. ). Patient states no new medicines, laundry detergent, lotions, or makeup. She is outside daily. She is in the woods a lot. Kids ride dirt bikes in the woods and she does their laundry. No one else in house is itchy. She has inside cat and dogs.  The following portions of the chart were reviewed this encounter and updated as appropriate:       Review of Systems:  No other skin or systemic complaints except as noted in HPI or Assessment and Plan.  Objective  Well appearing patient in no apparent distress; mood and affect are within normal limits.  A focused examination was performed including face, trunk, extremities. Relevant physical exam findings are noted in the Assessment and Plan.  Objective  legs, feet, abdomen, arms: Scaly erythematous papules and plaques +/- edema and vesiculation.    Assessment & Plan  Allergic contact dermatitis, unspecified trigger legs, feet, abdomen, arms  Possible contact to plant.  Start Prednisone 5mg  take po qam with food as directed until gone for 3 week taper dsp #150 0Rf.  Pt given instructions  Risks of prednisone taper discussed including mood irritability, insomnia, weight gain, stomach ulcers, increased risk of infection, increased blood sugar (diabetes), hypertension, osteoporosis with long-term or frequent use, and rare risk of avascular necrosis of the hip.   Start Clobetasol Cream Apply to AAs BID until rash improved. Avoid face, groin, axilla.  Dsp 60g 1Rf.   Continue mometasone ointment qd/bid Aas face.  Finish Bactrim as prescribed, although doubt bacterial infection.  predniSONE (DELTASONE) 5 MG tablet - legs, feet, abdomen, arms  clobetasol cream (TEMOVATE) 0.05 % - legs, feet, abdomen, arms  Return in about 1 month (around 02/14/2021) for follow-up rash.   I , CMA, am acting as scribe for 04/17/2021, MD .  Documentation: I have reviewed the above documentation for accuracy and completeness, and I agree with the above.  Cherlyn Labella MD

## 2021-01-15 NOTE — Patient Instructions (Addendum)
Take prednisone by mouth daily in morning with food starting at 60 mg dosage and gradually decreasing daily dose as directed until gone for a three week taper.  Patient was given written instructions.  Potential side effects reviewed. Risks of prednisone taper discussed including mood irritability, insomnia, weight gain, stomach ulcers, increased risk of infection, increased blood sugar (diabetes), hypertension, osteoporosis with long-term or frequent use, and rare risk of avascular necrosis of the hip.    3 Week Prednisone Taper  You will be given a prescription for 150 tablets of oral Prednisone. It is very important that you take this according to the exact schedule provided below. This type of regimen for taking medication is often called a "taper", because your dosage will steadily decrease over a two week period until it is discontinued altogether.  ALWAYS take this medicine with food to prevent it from irritating your stomach. You should also take your Prednisone during morning hours.  Call the clinic at [phone] if you gain more than two pounds in one day, notice swelling anywhere on your body, have shortness of breath, black or red bowel movements, brown or red vomitus, desire to drink large amounts of fluids, a fever, or extreme weakness.   Oral Prednisone over Three Weeks  Day  Week 1  Week 2  Week 3   1  12  tablets  9 tablets  5 tablets   2  12 tablets  8 tablets  5 tablets   3  11 tablets  8 tablets  4 tablets   4  11 tablets  7 tablets  4 tablets   5  10 tablets  7 tablets  3 tablets   6  10 tablets  6 tablets  2 tablets   7  9 tablets  6 tablets  1 tablet    If you have any questions or concerns for your doctor, please call our main line at [phone] and press option 4 to reach your doctor's medical assistant. If no one answers, please leave a voicemail as directed and we will return your call as soon as possible. Messages left after 4 pm will be answered the following  business day.   You may also send [phone] a message via MyChart. We typically respond to MyChart messages within 1-2 business days.  For prescription refills, please ask your pharmacy to contact our office. Our fax number is [phone].  If you have an urgent issue when the clinic is closed that cannot wait until the next business day, you can page your doctor at the number below.    Please note that while we do our best to be available for urgent issues outside of office hours, we are not available 24/7.   If you have an urgent issue and are unable to reach [phone], you may choose to seek medical care at your doctor's office, retail clinic, urgent care center, or emergency room.  If you have a medical emergency, please immediately call 911 or go to the emergency department.  Pager Numbers  - Dr. [place]: [phone]  - Dr. [phone]: [phone]  - Dr. [phone]: [phone]  In the event of inclement weather, please call our main line at [phone] for an update on the status of any delays or closures.  Dermatology Medication Tips: Please keep the boxes that topical medications come in in order to help keep track of the instructions about where and how to use these. Pharmacies typically print the medication instructions only on the boxes and  not directly on the medication tubes.   If your medication is too expensive, please contact our office at [phone] option 4 or send [place] a message through MyChart.   We are unable to tell what your co-pay for medications will be in advance as this is different depending on your insurance coverage. However, we may be able to find a substitute medication at lower cost or fill out paperwork to get insurance to cover a needed medication.   If a prior authorization is required to get your medication covered by your insurance company, please allow [place] 1-2 business days to complete this process.  Drug prices often vary depending on where the prescription is  filled and some pharmacies may offer cheaper prices.  The website www.goodrx.com contains coupons for medications through different pharmacies. The prices here do not account for what the cost may be with help from insurance (it may be cheaper with your insurance), but the website can give you the price if you did not use any insurance.  - You can print the associated coupon and take it with your prescription to the pharmacy.  - You may also stop by our office during regular business hours and pick up a GoodRx coupon card.  - If you need your prescription sent electronically to a different pharmacy, notify our office through [facility] or by phone at [phone] option 4.

## 2021-02-15 ENCOUNTER — Ambulatory Visit: Payer: Managed Care, Other (non HMO) | Admitting: Dermatology
